# Patient Record
Sex: Male | Born: 1955 | Race: White | Hispanic: No | Marital: Married | Smoking: Never smoker
Health system: Southern US, Community
[De-identification: ages and names within clinical notes are randomized; demographics above are authoritative.]

## PROBLEM LIST (undated history)

## (undated) DIAGNOSIS — I10 Essential (primary) hypertension: Secondary | ICD-10-CM

## (undated) HISTORY — PX: APPENDECTOMY: SHX54

---

## 2019-10-13 ENCOUNTER — Other Ambulatory Visit: Payer: Self-pay

## 2019-10-13 ENCOUNTER — Encounter (HOSPITAL_COMMUNITY): Payer: Self-pay | Admitting: Emergency Medicine

## 2019-10-13 ENCOUNTER — Inpatient Hospital Stay (HOSPITAL_COMMUNITY)
Admission: EM | Admit: 2019-10-13 | Discharge: 2019-10-14 | DRG: 247 | Disposition: A | Discharge status: Home / Self Care | Payer: Medicare Other | Attending: Cardiology | Admitting: Cardiology

## 2019-10-13 ENCOUNTER — Encounter (HOSPITAL_COMMUNITY)
Admission: EM | Disposition: A | Discharge status: Home / Self Care | Payer: Self-pay | Source: Home / Self Care | Attending: Cardiology

## 2019-10-13 ENCOUNTER — Emergency Department (HOSPITAL_COMMUNITY): Payer: Medicare Other

## 2019-10-13 DIAGNOSIS — I214 Non-ST elevation (NSTEMI) myocardial infarction: Principal | ICD-10-CM | POA: Diagnosis present

## 2019-10-13 DIAGNOSIS — Z20822 Contact with and (suspected) exposure to covid-19: Secondary | ICD-10-CM | POA: Diagnosis present

## 2019-10-13 DIAGNOSIS — I2510 Atherosclerotic heart disease of native coronary artery without angina pectoris: Secondary | ICD-10-CM | POA: Diagnosis present

## 2019-10-13 DIAGNOSIS — G4733 Obstructive sleep apnea (adult) (pediatric): Secondary | ICD-10-CM | POA: Diagnosis present

## 2019-10-13 DIAGNOSIS — R739 Hyperglycemia, unspecified: Secondary | ICD-10-CM | POA: Diagnosis present

## 2019-10-13 DIAGNOSIS — I10 Essential (primary) hypertension: Secondary | ICD-10-CM | POA: Diagnosis present

## 2019-10-13 DIAGNOSIS — I200 Unstable angina: Secondary | ICD-10-CM

## 2019-10-13 DIAGNOSIS — Z882 Allergy status to sulfonamides status: Secondary | ICD-10-CM | POA: Diagnosis not present

## 2019-10-13 DIAGNOSIS — E782 Mixed hyperlipidemia: Secondary | ICD-10-CM | POA: Diagnosis present

## 2019-10-13 DIAGNOSIS — Z888 Allergy status to other drugs, medicaments and biological substances status: Secondary | ICD-10-CM

## 2019-10-13 DIAGNOSIS — Z955 Presence of coronary angioplasty implant and graft: Secondary | ICD-10-CM

## 2019-10-13 DIAGNOSIS — Z885 Allergy status to narcotic agent status: Secondary | ICD-10-CM | POA: Diagnosis not present

## 2019-10-13 DIAGNOSIS — R0789 Other chest pain: Secondary | ICD-10-CM | POA: Diagnosis present

## 2019-10-13 DIAGNOSIS — Z8249 Family history of ischemic heart disease and other diseases of the circulatory system: Secondary | ICD-10-CM | POA: Diagnosis not present

## 2019-10-13 HISTORY — PX: LEFT HEART CATH AND CORONARY ANGIOGRAPHY: CATH118249

## 2019-10-13 HISTORY — PX: CORONARY STENT INTERVENTION: CATH118234

## 2019-10-13 HISTORY — DX: Essential (primary) hypertension: I10

## 2019-10-13 HISTORY — PX: CORONARY THROMBECTOMY: CATH118304

## 2019-10-13 LAB — CBC
HCT: 45 % (ref 39.0–52.0)
Hemoglobin: 16.2 g/dL (ref 13.0–17.0)
MCH: 33.5 pg (ref 26.0–34.0)
MCHC: 36 g/dL (ref 30.0–36.0)
MCV: 93.2 fL (ref 80.0–100.0)
Platelets: 192 10*3/uL (ref 150–400)
RBC: 4.83 MIL/uL (ref 4.22–5.81)
RDW: 12 % (ref 11.5–15.5)
WBC: 6.7 10*3/uL (ref 4.0–10.5)
nRBC: 0 % (ref 0.0–0.2)

## 2019-10-13 LAB — LIPID PANEL
Cholesterol: 173 mg/dL (ref 0–200)
HDL: 35 mg/dL — ABNORMAL LOW (ref >40)
LDL Cholesterol: 85 mg/dL (ref 0–99)
Total CHOL/HDL Ratio: 4.9 RATIO
Triglycerides: 265 mg/dL — ABNORMAL HIGH (ref <150)
VLDL: 53 mg/dL — ABNORMAL HIGH (ref 0–40)

## 2019-10-13 LAB — BASIC METABOLIC PANEL
Anion gap: 13 (ref 5–15)
BUN: 16 mg/dL (ref 8–23)
CO2: 19 mmol/L — ABNORMAL LOW (ref 22–32)
Calcium: 9.3 mg/dL (ref 8.9–10.3)
Chloride: 109 mmol/L (ref 98–111)
Creatinine, Ser: 1.23 mg/dL (ref 0.61–1.24)
GFR calc Af Amer: >60 mL/min (ref >60)
GFR calc non Af Amer: >60 mL/min (ref >60)
Glucose, Bld: 124 mg/dL — ABNORMAL HIGH (ref 70–99)
Potassium: 3.8 mmol/L (ref 3.5–5.1)
Sodium: 141 mmol/L (ref 135–145)

## 2019-10-13 LAB — RESPIRATORY PANEL BY RT PCR (FLU A&B, COVID)
Influenza A by PCR: NEGATIVE
Influenza B by PCR: NEGATIVE
SARS Coronavirus 2 by RT PCR: NEGATIVE

## 2019-10-13 LAB — CBG MONITORING, ED: Glucose-Capillary: 114 mg/dL — ABNORMAL HIGH (ref 70–99)

## 2019-10-13 LAB — TROPONIN I (HIGH SENSITIVITY)
Troponin I (High Sensitivity): 121 ng/L (ref <18)
Troponin I (High Sensitivity): 666 ng/L (ref <18)

## 2019-10-13 SURGERY — LEFT HEART CATH AND CORONARY ANGIOGRAPHY
Anesthesia: LOCAL

## 2019-10-13 MED ORDER — ONDANSETRON HCL 4 MG/2ML IJ SOLN: 4.0000 mg | Freq: Four times a day (QID) | INTRAMUSCULAR | Status: DC | PRN

## 2019-10-13 MED ORDER — TICAGRELOR 90 MG PO TABS
90.0000 mg | ORAL_TABLET | Freq: Two times a day (BID) | ORAL | Status: DC
  Administered 2019-10-14: 90 mg via ORAL
  Filled 2019-10-13: qty 1

## 2019-10-13 MED ORDER — LIDOCAINE HCL (PF) 1 % IJ SOLN
INTRAMUSCULAR | Status: DC | PRN
  Administered 2019-10-13: 2 mL

## 2019-10-13 MED ORDER — ASPIRIN 81 MG PO CHEW
81.0000 mg | CHEWABLE_TABLET | Freq: Every day | ORAL | Status: DC
  Administered 2019-10-14: 08:00:00 81 mg via ORAL
  Filled 2019-10-13: qty 1

## 2019-10-13 MED ORDER — SODIUM CHLORIDE 0.9% FLUSH
3.0000 mL | Freq: Two times a day (BID) | INTRAVENOUS | Status: DC
  Administered 2019-10-14: 3 mL via INTRAVENOUS

## 2019-10-13 MED ORDER — SODIUM CHLORIDE 0.9 % WEIGHT BASED INFUSION
1.0000 mL/kg/h | INTRAVENOUS | Status: AC
  Administered 2019-10-13: 1 mL/kg/h via INTRAVENOUS

## 2019-10-13 MED ORDER — HEPARIN SODIUM (PORCINE) 1000 UNIT/ML IJ SOLN
INTRAMUSCULAR | Status: AC
  Filled 2019-10-13: qty 1

## 2019-10-13 MED ORDER — SODIUM CHLORIDE 0.9% FLUSH: 3.0000 mL | INTRAVENOUS | Status: DC | PRN

## 2019-10-13 MED ORDER — ATORVASTATIN CALCIUM 80 MG PO TABS: 80.0000 mg | ORAL_TABLET | Freq: Every day | ORAL | Status: DC

## 2019-10-13 MED ORDER — IOHEXOL 350 MG/ML SOLN
INTRAVENOUS | Status: DC | PRN
  Administered 2019-10-13: 195 mL

## 2019-10-13 MED ORDER — TICAGRELOR 90 MG PO TABS
ORAL_TABLET | ORAL | Status: DC | PRN
  Administered 2019-10-13: 180 mg via ORAL

## 2019-10-13 MED ORDER — TIROFIBAN HCL IN NACL 5-0.9 MG/100ML-% IV SOLN
INTRAVENOUS | Status: DC | PRN
  Administered 2019-10-13: 0.15 ug/kg/min via INTRAVENOUS

## 2019-10-13 MED ORDER — MIDAZOLAM HCL 2 MG/2ML IJ SOLN
INTRAMUSCULAR | Status: AC
  Filled 2019-10-13: qty 2

## 2019-10-13 MED ORDER — HEPARIN (PORCINE) IN NACL 1000-0.9 UT/500ML-% IV SOLN
INTRAVENOUS | Status: AC
  Filled 2019-10-13: qty 1000

## 2019-10-13 MED ORDER — LIDOCAINE HCL (PF) 1 % IJ SOLN
INTRAMUSCULAR | Status: AC
  Filled 2019-10-13: qty 30

## 2019-10-13 MED ORDER — NITROGLYCERIN 1 MG/10 ML FOR IR/CATH LAB
INTRA_ARTERIAL | Status: AC
  Filled 2019-10-13: qty 10

## 2019-10-13 MED ORDER — HYDRALAZINE HCL 20 MG/ML IJ SOLN: 5.0000 mg | INTRAMUSCULAR | Status: AC | PRN

## 2019-10-13 MED ORDER — TICAGRELOR 90 MG PO TABS
ORAL_TABLET | ORAL | Status: AC
  Filled 2019-10-13: qty 2

## 2019-10-13 MED ORDER — NITROGLYCERIN IN D5W 200-5 MCG/ML-% IV SOLN
0.0000 ug/min | INTRAVENOUS | Status: DC
  Administered 2019-10-13: 5 ug/min via INTRAVENOUS
  Filled 2019-10-13: qty 250

## 2019-10-13 MED ORDER — FENTANYL CITRATE (PF) 100 MCG/2ML IJ SOLN
INTRAMUSCULAR | Status: DC | PRN
  Administered 2019-10-13 (×2): 25 ug via INTRAVENOUS

## 2019-10-13 MED ORDER — VERAPAMIL HCL 2.5 MG/ML IV SOLN
INTRAVENOUS | Status: DC | PRN
  Administered 2019-10-13: 18:00:00 5 mL via INTRA_ARTERIAL

## 2019-10-13 MED ORDER — NITROGLYCERIN 1 MG/10 ML FOR IR/CATH LAB
INTRA_ARTERIAL | Status: DC | PRN
  Administered 2019-10-13: 200 ug via INTRACORONARY

## 2019-10-13 MED ORDER — ONDANSETRON HCL 4 MG/2ML IJ SOLN
INTRAMUSCULAR | Status: AC
  Filled 2019-10-13: qty 2

## 2019-10-13 MED ORDER — HEPARIN (PORCINE) IN NACL 1000-0.9 UT/500ML-% IV SOLN
INTRAVENOUS | Status: DC | PRN
  Administered 2019-10-13 (×2): 500 mL

## 2019-10-13 MED ORDER — TIROFIBAN HCL IN NACL 5-0.9 MG/100ML-% IV SOLN
INTRAVENOUS | Status: AC
  Filled 2019-10-13: qty 100

## 2019-10-13 MED ORDER — MIDAZOLAM HCL 2 MG/2ML IJ SOLN
INTRAMUSCULAR | Status: DC | PRN
  Administered 2019-10-13: 2 mg via INTRAVENOUS
  Administered 2019-10-13: 1 mg via INTRAVENOUS

## 2019-10-13 MED ORDER — HEPARIN BOLUS VIA INFUSION
4000.0000 [IU] | Freq: Once | INTRAVENOUS | Status: AC
  Administered 2019-10-13: 4000 [IU] via INTRAVENOUS
  Filled 2019-10-13: qty 4000

## 2019-10-13 MED ORDER — TIROFIBAN (AGGRASTAT) BOLUS VIA INFUSION
INTRAVENOUS | Status: DC | PRN
  Administered 2019-10-13: 2097.5 ug via INTRAVENOUS

## 2019-10-13 MED ORDER — HEPARIN (PORCINE) 25000 UT/250ML-% IV SOLN
1000.0000 [IU]/h | INTRAVENOUS | Status: DC
  Administered 2019-10-13: 1000 [IU]/h via INTRAVENOUS
  Filled 2019-10-13: qty 250

## 2019-10-13 MED ORDER — VERAPAMIL HCL 2.5 MG/ML IV SOLN
INTRAVENOUS | Status: AC
  Filled 2019-10-13: qty 2

## 2019-10-13 MED ORDER — ONDANSETRON HCL 4 MG/2ML IJ SOLN
INTRAMUSCULAR | Status: DC | PRN
  Administered 2019-10-13: 4 mg via INTRAVENOUS

## 2019-10-13 MED ORDER — SODIUM CHLORIDE 0.9 % IV SOLN: 250.0000 mL | INTRAVENOUS | Status: DC | PRN

## 2019-10-13 MED ORDER — FENTANYL CITRATE (PF) 100 MCG/2ML IJ SOLN
INTRAMUSCULAR | Status: AC
  Filled 2019-10-13: qty 2

## 2019-10-13 MED ORDER — FENTANYL CITRATE (PF) 100 MCG/2ML IJ SOLN
50.0000 ug | INTRAMUSCULAR | Status: DC | PRN
  Administered 2019-10-13 – 2019-10-14 (×2): 50 ug via INTRAVENOUS
  Filled 2019-10-13 (×2): qty 2

## 2019-10-13 MED ORDER — HEPARIN SODIUM (PORCINE) 1000 UNIT/ML IJ SOLN
INTRAMUSCULAR | Status: DC | PRN
  Administered 2019-10-13: 3000 [IU] via INTRAVENOUS
  Administered 2019-10-13: 8000 [IU] via INTRAVENOUS
  Administered 2019-10-13: 2000 [IU] via INTRAVENOUS

## 2019-10-13 SURGICAL SUPPLY — 21 items
BALLN SAPPHIRE 2.5X12 (BALLOONS) ×4
BALLN SAPPHIRE 3.0X12 (BALLOONS) ×4
BALLN SAPPHIRE 3.0X15 (BALLOONS) ×2
BALLOON SAPPHIRE 2.5X12 (BALLOONS) ×2 IMPLANT
BALLOON SAPPHIRE 3.0X12 (BALLOONS) ×2 IMPLANT
BALLOON SAPPHIRE 3.0X15 (BALLOONS) ×1 IMPLANT
CATH EXTRAC PRONTO 5.5F 138CM (CATHETERS) ×2 IMPLANT
CATH OPTITORQUE TIG 4.0 5F (CATHETERS) ×2 IMPLANT
CATH VISTA GUIDE 6FR XBLAD3.5 (CATHETERS) ×2 IMPLANT
DEVICE RAD COMP TR BAND LRG (VASCULAR PRODUCTS) ×2 IMPLANT
ELECT DEFIB PAD ADLT CADENCE (PAD) ×2 IMPLANT
GLIDESHEATH SLEND A-KIT 6F 22G (SHEATH) ×2 IMPLANT
GUIDEWIRE INQWIRE 1.5J.035X260 (WIRE) ×1 IMPLANT
INQWIRE 1.5J .035X260CM (WIRE) ×2
KIT ENCORE 26 ADVANTAGE (KITS) ×4 IMPLANT
KIT HEART LEFT (KITS) ×2 IMPLANT
PACK CARDIAC CATHETERIZATION (CUSTOM PROCEDURE TRAY) ×2 IMPLANT
STENT RESOLUTE ONYX 3.5X22 (Permanent Stent) ×2 IMPLANT
TRANSDUCER W/STOPCOCK (MISCELLANEOUS) ×2 IMPLANT
TUBING CIL FLEX 10 FLL-RA (TUBING) ×2 IMPLANT
WIRE COUGAR XT STRL 190CM (WIRE) ×4 IMPLANT

## 2019-10-13 NOTE — ED Notes (Signed)
Labs sent. Sent blue and dr green with temp labels.

## 2019-10-13 NOTE — Progress Notes (Signed)
ANTICOAGULATION CONSULT NOTE - Initial Consult  Pharmacy Consult for heparin Indication: chest pain/ACS  Allergies  Allergen Reactions  . Morphine And Related   . Proventil [Albuterol]     Patient Measurements: Height: 5\' 10"  (177.8 cm) Weight: 185 lb (83.9 kg) IBW/kg (Calculated) : 73 Heparin Dosing Weight: 83.9 kg (estimated weight)  Vital Signs: Temp: 98.5 F (36.9 C) (02/18 1510) Temp Source: Oral (02/18 1510) BP: 142/75 (02/18 1510) Pulse Rate: 92 (02/18 1510)  Labs: No results for input(s): HGB, HCT, PLT, APTT, LABPROT, INR, HEPARINUNFRC, HEPRLOWMOCWT, CREATININE, CKTOTAL, CKMB, TROPONINIHS in the last 72 hours.  CrCl cannot be calculated (No successful lab value found.).   Medical History: Past Medical History:  Diagnosis Date  . Hypertension     Medications:  Scheduled:  . heparin  4,000 Units Intravenous Once   Infusions:  . heparin    . nitroGLYCERIN      Assessment: 64 yo M with chest pain. No anticoagulants PTA per patient. Pharmacy consulted to initiate heparin drip for ACS.  Goal of Therapy:  Heparin level 0.3-0.7 units/ml Monitor platelets by anticoagulation protocol: Yes   Plan:  Heparin 4,000 unit IV x 1, then heparin IV 1,000 units/hr  Check 6 hour heparin level Daily heparin level and CBC Monitor for bleeding  69, PharmD, [facility] PGY2 Cardiology Pharmacy Resident Phone [phone] 10/13/2019       3:27 PM  Please check [email or web address] for unit-specific pharmacist phone numbers

## 2019-10-13 NOTE — ED Triage Notes (Signed)
Cp after pulling multiple times on generator ysterday , got better but today had another episode  Had a lot of cp and sob with least exertion and just walking has iv 20 left hand  Given 324 asa and 2 nitro per ems has large  Family hx

## 2019-10-13 NOTE — H&P (Signed)
CARDIOLOGY ADMIT NOTE   Patient ID: Ruben Rosales MRN: [id number] DOB/AGE: 12-05-1955 64 y.o.  Admit date: 10/13/2019 Primary Physician:  No primary care provider on file.  Patient ID: Ruben Rosales, male    DOB: Jan 14, 1956, 64 y.o.   MRN: [id number]  Chief Complaint  Patient presents with  . Chest Pain   HPI:    Ruben Rosales  is a 64 y.o. Caucasian male with history of hypertension but no other cardiovascular history, strong family still premature coronary disease, brother has had MI at age 31, father with MI at age 68 and grandmother paternal also had MI in her 23s.  He works in [employer], was trying to light of the generator yesterday and had severe chest tightness associated with shortness of breath.  He rested for a while with relief, but then started having chest discomfort again even with minimal activities around the house.  After he rested felt better, went to bed, this morning went to work and every time he would walk up the hill while he was working on a generator, he had to stop and had chest tightness associated with marked dyspnea.  Eventually chest pain was persistent associated with marked dyspnea presented to the emergency room.  In the ED he was started on IV heparin and nitroglycerin.  In spite of this continues to have chest pain.  States that it is becoming very difficult for him to breathe.  Denies hemoptysis, no recent travel, no leg edema.  He is a non-smoker, does not use any tobacco products.  Past Medical History:  Diagnosis Date  . Hypertension    Family history: Please see my HPI.  Social History   Socioeconomic History  . Marital status: Married    Spouse name: Not on file  . Number of children: Not on file  . Years of education: Not on file  . Highest education level: Not on file  Occupational History  . Not on file  Tobacco Use  . Smoking status: Never Smoker  . Smokeless tobacco: Never Used  Substance and Sexual Activity  . Alcohol use: Yes  .  Drug use: Never  . Sexual activity: Not on file  Other Topics Concern  . Not on file  Social History Narrative  . Not on file   Social Determinants of Health   Financial Resource Strain:   . Difficulty of Paying Living Expenses: Not on file  Food Insecurity:   . Worried About Programme researcher, broadcasting/film/video in the Last Year: Not on file  . Ran Out of Food in the Last Year: Not on file  Transportation Needs:   . Lack of Transportation (Medical): Not on file  . Lack of Transportation (Non-Medical): Not on file  Physical Activity:   . Days of Exercise per Week: Not on file  . Minutes of Exercise per Session: Not on file  Stress:   . Feeling of Stress : Not on file  Social Connections:   . Frequency of Communication with Friends and Family: Not on file  . Frequency of Social Gatherings with Friends and Family: Not on file  . Attends Religious Services: Not on file  . Active Member of Clubs or Organizations: Not on file  . Attends Banker Meetings: Not on file  . Marital Status: Not on file  Intimate Partner Violence:   . Fear of Current or Ex-Partner: Not on file  . Emotionally Abused: Not on file  . Physically Abused: Not on file  .  Sexually Abused: Not on file   ROS  Review of Systems  Cardiovascular: Positive for chest pain and dyspnea on exertion. Negative for leg swelling.  Gastrointestinal: Negative for melena.  All other systems reviewed and are negative.  Objective   Vitals with BMI 10/13/2019 10/13/2019 10/13/2019  Height - - -  Weight - - -  BMI - - -  Systolic 119 126 712  Diastolic 62 67 70  Pulse 88 91 91      Physical Exam  Cardiovascular: Normal rate, regular rhythm, normal heart sounds and intact distal pulses. Exam reveals no gallop.  No murmur heard. No leg edema, no JVD.  Pulmonary/Chest: Effort normal and breath sounds normal.  Abdominal: Soft. Bowel sounds are normal.  Skin: Skin is warm and dry.   Laboratory examination:   Recent Labs     10/13/19 1553  NA 141  K 3.8  CL 109  CO2 19*  GLUCOSE 124*  BUN 16  CREATININE 1.23  CALCIUM 9.3  GFRNONAA >60  GFRAA >60   estimated creatinine clearance is 61 mL/min (by C-G formula based on SCr of 1.23 mg/dL).  CMP Latest Ref Rng & Units 10/13/2019  Glucose 70 - 99 mg/dL 197(J)  BUN 8 - 23 mg/dL 16  Creatinine 8.83 - 2.54 mg/dL 9.82  Sodium 641 - 583 mmol/L 141  Potassium 3.5 - 5.1 mmol/L 3.8  Chloride 98 - 111 mmol/L 109  CO2 22 - 32 mmol/L 19(L)  Calcium 8.9 - 10.3 mg/dL 9.3   CBC Latest Ref Rng & Units 10/13/2019  WBC 4.0 - 10.5 K/uL 6.7  Hemoglobin 13.0 - 17.0 g/dL 09.4  Hematocrit 07.6 - 52.0 % 45.0  Platelets 150 - 400 K/uL 192    Ref Range & Units 15:53  Troponin I (High Sensitivity) <18 ng/L 121High Panic    Comment: CRITICAL RESULT CALLED TO, READ BACK BY AND VERIFIED WITH:          Lipid Panel  No results found for: CHOL, TRIG, HDL, CHOLHDL, VLDL, LDLCALC, LDLDIRECT HEMOGLOBIN A1C No results found for: HGBA1C, MPG TSH No results for input(s): TSH in the last 8760 hours. BNP (last 3 results) No results for input(s): BNP in the last 8760 hours.  Medications and allergies   Allergies  Allergen Reactions  . Proventil [Albuterol] Shortness Of Breath  . Sulfamethoxazole Nausea And Vomiting  . Morphine And Related Rash  . Oxycodone-Acetaminophen Nausea Only     Prior to Admission medications   Not on File    . heparin 1,000 Units/hr (10/13/19 1548)  . [MAR Hold] nitroGLYCERIN 5 mcg/min (10/13/19 1552)    No current outpatient medications  No intake/output data recorded. No intake/output data recorded.    Radiology:  DG Chest Portable 1 View  Result Date: 10/13/2019 CLINICAL DATA:  64 year old male with chest pain. EXAM: PORTABLE CHEST 1 VIEW COMPARISON:  None. FINDINGS: Minimal left lung base linear atelectasis/scarring. No focal consolidation, pleural effusion, or pneumothorax. The cardiac silhouette is within normal limits. No acute  osseous pathology. IMPRESSION: No active disease. Electronically Signed   By: Elgie Collard M.D.   On: 10/13/2019 16:02   Cardiac Studies:   EKG 10/13/2019: Normal sinus rhythm.  Assessment   1.  Non-STEMI presenting with chest pain and marked dyspnea. 2.  Strong family still premature artery disease 3.  Hypertension  Recommendations:   Patient with ongoing chest pain in spite of being on IV heparin and nitroglycerin, although EKG silent, suspect he probably has significant  coronary disease.  Do not suspect pulmonary embolism.  We will schedule him for urgent cardiac catheterization and make further recommendations.  I called his wife over the telephone and explained to her regarding the process.  I also discussed with the patient regarding risks and benefits of cardiac catheterization.  Schedule for cardiac catheterization, and possible angioplasty. We discussed regarding risks, benefits, alternatives to this including stress testing, CTA and continued medical therapy. Patient wants to proceed. Understands <1-2% risk of death, stroke, MI, urgent CABG, bleeding, infection, renal failure but not limited to these.   Adrian Prows, MD, [facility] 10/13/2019, 5:19 PM [facility] [place] Pager: [phone] Office: [phone]

## 2019-10-13 NOTE — ED Provider Notes (Signed)
[facility] EMERGENCY DEPARTMENT Provider Note   CSN: [id number] Arrival date & time: 10/13/19  1458     History Chief Complaint  Patient presents with  . Chest Pain    Ruben Rosales is a 64 y.o. male.  Patient is a 64 year old male with a history of hypertension presenting today via EMS with chest pain.  Patient reports the pain started last night after he was attempting to start a generator.  He states that he was pulling repeatedly because the generator would not start and pulled approximately 100 times.  It was after doing this that he started feeling a very heavy sensation in the left portion of his chest and shortness of breath.  The symptoms did improve after he sat down and rested and went away to where he went to bed.  However he works with air conditioning and heating and when he got to work today he had to walk up an incline to the house and the chest pain started again with significant shortness of breath but would only last for 5 or 10 minutes and when he would sit down the pain would go away.  He was able to finish his first job but when he went to the next house he walked around the corner of the back of the house and symptoms started again to the point where he did not think he could make it back to the truck.  In route with EMS he was given 2 nitroglycerin and aspirin and states the pain went from an 8 out of 10 to a 2 out of 10 and he is feeling better.  He denies any cough, congestion.  There is no pleuritic type pain.  He was pulling the generator with his right arm not his left and he has had no pain on the right side of his chest.  He denies any abdominal pain, nausea or vomiting.  No similar symptoms.  He does not use tobacco, drugs or alcohol.  He only takes losartan and he is not prescribed any other medications.  No recent immobilization, abnormal sensation in the legs and reports that his arm pain and numbness went away after taking the  nitroglycerin.  The history is provided by the patient.  Chest Pain Pain location:  Substernal area, L chest and L lateral chest Pain quality: aching, pressure and tightness   Pain radiates to:  L shoulder and L arm Pain severity:  Severe Onset quality:  Gradual Duration:  24 hours Timing:  Intermittent Progression:  Improving Chronicity:  New Associated symptoms: shortness of breath   Associated symptoms: no abdominal pain, no altered mental status, no anorexia, no back pain, no cough, no diaphoresis, no dizziness, no lower extremity edema, no nausea and no near-syncope   Risk factors: hypertension and male sex   Risk factors: no coronary artery disease, no diabetes mellitus, not obese, no prior DVT/PE and no smoking   Risk factors comment:  Father, paternal grandfather and uncles and older brother all with MI in their 92's.      Past Medical History:  Diagnosis Date  . Hypertension     There are no problems to display for this patient.        No family history on file.  Social History   Tobacco Use  . Smoking status: Never Smoker  . Smokeless tobacco: Never Used  Substance Use Topics  . Alcohol use: Yes  . Drug use: Never    Home Medications Prior  to Admission medications   Not on File    Allergies    Morphine and related and Proventil [albuterol]  Review of Systems   Review of Systems  Constitutional: Negative for diaphoresis.  Respiratory: Positive for shortness of breath. Negative for cough.   Cardiovascular: Positive for chest pain. Negative for near-syncope.  Gastrointestinal: Negative for abdominal pain, anorexia and nausea.  Musculoskeletal: Negative for back pain.  Neurological: Negative for dizziness.  All other systems reviewed and are negative.   Physical Exam Updated Vital Signs BP (!) 142/75 (BP Location: Right Arm)   Pulse 92   Temp 98.5 F (36.9 C) (Oral)   Resp (!) 22   SpO2 93%   Physical Exam Vitals and nursing note  reviewed.  Constitutional:      General: He is not in acute distress.    Appearance: He is well-developed and normal weight.  HENT:     Head: Normocephalic and atraumatic.  Eyes:     Conjunctiva/sclera: Conjunctivae normal.     Pupils: Pupils are equal, round, and reactive to light.  Cardiovascular:     Rate and Rhythm: Normal rate and regular rhythm.     Pulses: Normal pulses.     Heart sounds: No murmur.  Pulmonary:     Effort: Pulmonary effort is normal. Tachypnea present. No respiratory distress.     Breath sounds: Normal breath sounds. No wheezing or rales.  Chest:     Chest wall: No tenderness.  Abdominal:     General: There is no distension.     Palpations: Abdomen is soft.     Tenderness: There is no abdominal tenderness. There is no guarding or rebound.  Musculoskeletal:        General: No tenderness. Normal range of motion.     Cervical back: Normal range of motion and neck supple.     Right lower leg: No edema.     Left lower leg: No edema.  Skin:    General: Skin is warm and dry.     Capillary Refill: Capillary refill takes less than 2 seconds.     Findings: No erythema or rash.  Neurological:     General: No focal deficit present.     Mental Status: He is alert and oriented to person, place, and time. Mental status is at baseline.  Psychiatric:        Mood and Affect: Mood normal.        Behavior: Behavior normal.        Thought Content: Thought content normal.     ED Results / Procedures / Treatments   Labs (all labs ordered are listed, but only abnormal results are displayed) Labs Reviewed  CBG MONITORING, ED - Abnormal; Notable for the following components:      Result Value   Glucose-Capillary 114 (*)    All other components within normal limits  RESPIRATORY PANEL BY RT PCR (FLU A&B, COVID)  CBC  BASIC METABOLIC PANEL  HEPARIN LEVEL (UNFRACTIONATED)  POC SARS CORONAVIRUS 2 AG -  ED  TROPONIN I (HIGH SENSITIVITY)    EKG EKG  Interpretation  Date/Time:  Thursday October 13 2019 15:04:25 EST Ventricular Rate:  93 PR Interval:    QRS Duration: 99 QT Interval:  370 QTC Calculation: 461 R Axis:   38 Text Interpretation: Sinus rhythm RSR' in V1 or V2, right VCD or RVH No previous tracing Confirmed by Blanchie Dessert [id number]) on 10/13/2019 3:27:43 PM   Radiology DG Chest Portable 1 View  Result Date: 10/13/2019 CLINICAL DATA:  64 year old male with chest pain. EXAM: PORTABLE CHEST 1 VIEW COMPARISON:  None. FINDINGS: Minimal left lung base linear atelectasis/scarring. No focal consolidation, pleural effusion, or pneumothorax. The cardiac silhouette is within normal limits. No acute osseous pathology. IMPRESSION: No active disease. Electronically Signed   By: Elgie Collard M.D.   On: 10/13/2019 16:02    Procedures Procedures (including critical care time)  Medications Ordered in ED Medications  nitroGLYCERIN 50 mg in dextrose 5 % 250 mL (0.2 mg/mL) infusion (has no administration in time range)    ED Course  I have reviewed the triage vital signs and the nursing notes.  Pertinent labs & imaging results that were available during my care of the patient were reviewed by me and considered in my medical decision making (see chart for details).    MDM Rules/Calculators/A&P                      64 year old male presenting today with concerning history for stable angina.  Symptoms started yesterday after attempting to get a generator started.  He describes it as a crushing pain in the left center of his chest that today radiated down his right arm every time he started doing something.  He notes that it became significantly worse anytime he tried to walk up an incline and he was significantly short of breath.  He received 2 nitroglycerin and 325 of aspirin in route by EMS and states now the pain is 2 out of 10.  Its not pleuritic in nature and he denies any infectious symptoms.  Patient has breath sounds equally  and lower suspicion for pneumothorax.  Low suspicion for musculoskeletal as he was using his right arm to try to start the generator and the pain is in the left side of his chest.  He has significant family history of his father, paternal grandfather and uncles and his older brother all with MIs in their 40s.  Patient reports that he does not smoke or use alcohol.  He does have hypertension but does not take medicine for hyperlipidemia.  Low suspicion for PE at this time.  Low suspicion for dissection.  Patient started on a nitroglycerin drip and heparin gtt, labs pending. Initial EKG without acute findings.  Due to concerning story and pt still having mild active chest pain will consult cards.  CRITICAL CARE Performed by: Genie Wenke Total critical care time: 30 minutes Critical care time was exclusive of separately billable procedures and treating other patients. Critical care was necessary to treat or prevent imminent or life-threatening deterioration. Critical care was time spent personally by me on the following activities: development of treatment plan with patient and/or surrogate as well as nursing, discussions with consultants, evaluation of patient's response to treatment, examination of patient, obtaining history from patient or surrogate, ordering and performing treatments and interventions, ordering and review of laboratory studies, ordering and review of radiographic studies, pulse oximetry and re-evaluation of patient's condition.   Final Clinical Impression(s) / ED Diagnoses Final diagnoses:  Unstable angina [facility])    Rx / DC Orders ED Discharge Orders    None       Gwyneth Sprout, MD 10/13/19 1617

## 2019-10-13 NOTE — Interval H&P Note (Signed)
History and Physical Interval Note:  10/13/2019 5:27 PM  Ruben Rosales  has presented today for surgery, with the diagnosis of unstable angina.  The various methods of treatment have been discussed with the patient and family. After consideration of risks, benefits and other options for treatment, the patient has consented to  Procedure(s): LEFT HEART CATH AND CORONARY ANGIOGRAPHY (N/A) and possible angioplasty as a surgical intervention.  The patient's history has been reviewed, patient examined, no change in status, stable for surgery.  I have reviewed the patient's chart and labs.  Questions were answered to the patient's satisfaction.   Cath Lab Visit (complete for each Cath Lab visit)  Clinical Evaluation Leading to the Procedure:   ACS: Yes.    Non-ACS:    Anginal Classification: CCS IV  Anti-ischemic medical therapy: Minimal Therapy (1 class of medications)  Non-Invasive Test Results: No non-invasive testing performed  Prior CABG: No previous CABG  Yates Decamp

## 2019-10-14 ENCOUNTER — Inpatient Hospital Stay (HOSPITAL_COMMUNITY): Payer: Medicare Other

## 2019-10-14 LAB — BASIC METABOLIC PANEL
Anion gap: 6 (ref 5–15)
BUN: 14 mg/dL (ref 8–23)
CO2: 17 mmol/L — ABNORMAL LOW (ref 22–32)
Calcium: 8.6 mg/dL — ABNORMAL LOW (ref 8.9–10.3)
Chloride: 117 mmol/L — ABNORMAL HIGH (ref 98–111)
Creatinine, Ser: 1.08 mg/dL (ref 0.61–1.24)
GFR calc Af Amer: >60 mL/min (ref >60)
GFR calc non Af Amer: >60 mL/min (ref >60)
Glucose, Bld: 115 mg/dL — ABNORMAL HIGH (ref 70–99)
Potassium: 4.1 mmol/L (ref 3.5–5.1)
Sodium: 140 mmol/L (ref 135–145)

## 2019-10-14 LAB — ECHOCARDIOGRAM COMPLETE
Height: 70 in
Weight: 2960 oz

## 2019-10-14 LAB — CBC
HCT: 42.2 % (ref 39.0–52.0)
Hemoglobin: 15.3 g/dL (ref 13.0–17.0)
MCH: 33.8 pg (ref 26.0–34.0)
MCHC: 36.3 g/dL — ABNORMAL HIGH (ref 30.0–36.0)
MCV: 93.2 fL (ref 80.0–100.0)
Platelets: 169 10*3/uL (ref 150–400)
RBC: 4.53 MIL/uL (ref 4.22–5.81)
RDW: 12.3 % (ref 11.5–15.5)
WBC: 7.8 10*3/uL (ref 4.0–10.5)
nRBC: 0 % (ref 0.0–0.2)

## 2019-10-14 LAB — POCT ACTIVATED CLOTTING TIME
Activated Clotting Time: 268 seconds
Activated Clotting Time: 351 seconds
Activated Clotting Time: 709 seconds

## 2019-10-14 LAB — TSH: TSH: 1.816 u[IU]/mL (ref 0.350–4.500)

## 2019-10-14 LAB — HEMOGLOBIN A1C
Hgb A1c MFr Bld: 5.6 % (ref 4.8–5.6)
Mean Plasma Glucose: 114.02 mg/dL

## 2019-10-14 MED ORDER — METOPROLOL SUCCINATE ER 50 MG PO TB24: 50.0000 mg | ORAL_TABLET | Freq: Every day | ORAL | 2 refills | Status: DC

## 2019-10-14 MED ORDER — TICAGRELOR 90 MG PO TABS: 90.0000 mg | ORAL_TABLET | Freq: Two times a day (BID) | ORAL | 0 refills | Status: DC

## 2019-10-14 MED ORDER — ATORVASTATIN CALCIUM 80 MG PO TABS: 80.0000 mg | ORAL_TABLET | Freq: Every day | ORAL | 2 refills | Status: DC

## 2019-10-14 MED ORDER — ASPIRIN 81 MG PO CHEW: 81.0000 mg | CHEWABLE_TABLET | Freq: Every day | ORAL | Status: AC

## 2019-10-14 NOTE — Progress Notes (Signed)
CARDIAC REHAB PHASE I   PRE:  Rate/Rhythm: 89 SR  BP:  Supine: 126/66  Sitting:   Standing:    SaO2: 96%RA  MODE:  Ambulation: 450 ft   POST:  Rate/Rhythm: 101 ST  BP:  Supine:   Sitting: 114/70  Standing:    SaO2: 98%RA 0809-0904 Pt walked 450 ft on RA with steady gait and no CP. Tolerated well. MI education completed with pt who voiced understanding. Stressed importance of brilinta with stent. Reviewed NTG use, MI restrictions, heart healthy food choices, walking for ex, and CRP 2. Referral letter to [facility] CRP 2.    Luetta Nutting, RN BSN  10/14/2019 9:01 AM

## 2019-10-14 NOTE — Progress Notes (Signed)
  Echocardiogram 2D Echocardiogram has been performed.  Leta Jungling M 10/14/2019, 1:10 PM

## 2019-10-14 NOTE — Care Management (Signed)
Per Rayford Halsted [facility] help desk.  Co-pay for Brilinta 90 mg.bid $47.00 for a 30 day supply.   No PA required Deductible not met. Tier: 3 Retail pharmacy: [facility].  Ref# [id number]

## 2019-10-14 NOTE — Progress Notes (Signed)
TR BAND REMOVAL  LOCATION:  right radial  DEFLATED PER PROTOCOL:  Yes.    TIME BAND OFF / DRESSING APPLIED:  2315  SITE UPON ARRIVAL:   Level 2  SITE AFTER BAND REMOVAL:  Level 2  CIRCULATION SENSATION AND MOVEMENT:  Within Normal Limits  Yes.    COMMENTS:  Arrived from cath lab with large hematoma  Approximate 7 cm width and 3 cm in height above the radial site. BP cuff already on arm for pressure management protocol. Shortly after patients arrival to floor Dr.Ganji came to bedside to assess patient. MD wanted me to proceed with TR band removal 2 hours post placement and continue to pressure cuff management to reduce hematoma. TR band removed with no complications. Hematoma greatly reduced awhile later after band removal. Right fingers warm with good capillary refill and sensation present. Radial pulse palpable. Patient instructed not to use arm for activity and keep elevated.

## 2019-10-14 NOTE — TOC Progression Note (Signed)
Transition of Care [facility]) - Progression Note    Patient Details  Name: Ruben Rosales MRN: [id number] Date of Birth: [birth year]  [facility] of [facility]) CM/SW Contact  Leone Haven, RN Phone Number: 10/14/2019, 11:01 AM  Clinical Narrative:    NCM spoke with patient, informed him of the brilinta co pay of 47.00 for refills. Danford Bad RN will give patient the 30 day coupon.  He states he understands. He has no other needs.        Expected Discharge Plan and Services                                                 Social Determinants of Health (SDOH) Interventions    Readmission Risk Interventions No flowsheet data found.

## 2019-10-14 NOTE — Discharge Instructions (Signed)
Angina  Angina is very bad discomfort or pain in the chest, neck, arm, jaw, or back. The discomfort is caused by a lack of blood in the middle layer of the heart wall (myocardium). What are the causes? This condition is caused by a buildup of fat and cholesterol (plaque) in your arteries (atherosclerosis). This buildup narrows the arteries and makes it hard for blood to flow. What increases the risk? You are more likely to develop this condition if:  You have high levels of cholesterol in your blood.  You have high blood pressure (hypertension).  You have diabetes.  You have a family history of heart disease.  You are not active, or you do not exercise enough.  You feel sad (depressed).  You have been treated with high energy rays (radiation) on the left side of your chest. Other risk factors are:  Using tobacco.  Being very overweight (obese).  Eating a diet high in unhealthy fats (saturated fats).  Having stress, or being exposed to things that cause stress.  Using drugs, such as cocaine. Women have a greater risk for angina if:  They are older than 55.  They have stopped having their period (are in postmenopause). What are the signs or symptoms? Common symptoms of this condition in both men and women may include:  Chest pain, which may: ? Feel like a crushing or squeezing in the chest. ? Feel like a tightness, pressure, fullness, or heaviness in the chest. ? Last for more than a few minutes at a time. ? Stop and come back (recur) after a few minutes.  Pain in the neck, arm, jaw, or back.  Heartburn or upset stomach (indigestion) for no reason.  Being short of breath.  Feeling sick to your stomach (nauseous).  Sudden cold sweats. Women and people with diabetes may have other symptoms that are not usual, such as feeling:  Tired (fatigue).  Worried or nervous (anxious) for no reason.  Weak for no reason.  Dizzy or passing out (fainting). How is this  treated? This condition may be treated with:  Medicines. These are given to: ? Prevent blood clots. ? Prevent heart attack. ? Relax blood vessels and improve blood flow to the heart (nitrates). ? Reduce blood pressure. ? Improve the pumping action of the heart. ? Reduce fat and cholesterol in the blood.  A procedure to widen a narrowed or blocked artery in the heart (angioplasty).  Surgery to allow blood to go around a blocked artery (coronary artery bypass surgery). Follow these instructions at home: Medicines  Take over-the-counter and prescription medicines only as told by your doctor.  Do not take these medicines unless your doctor says that you can: ? NSAIDs. These include:  Ibuprofen.  Naproxen. ? Vitamin supplements that have vitamin A, vitamin E, or both. ? Hormone therapy that contains estrogen with or without progestin. Eating and drinking   Eat a heart-healthy diet that includes: ? Lots of fresh fruits and vegetables. ? Whole grains. ? Low-fat (lean) protein. ? Low-fat dairy products.  Follow instructions from your doctor about what you cannot eat or drink. Activity  Follow an exercise program that your doctor tells you.  Talk with your doctor about joining a program to help improve the health of your heart (cardiac rehab).  When you feel tired, take a break. Plan breaks if you know you are going to feel tired. Lifestyle   Do not use any products that contain nicotine or tobacco. This includes cigarettes, e-cigarettes, and   chewing tobacco. If you need help quitting, ask your doctor.  If your doctor says you can drink alcohol: ? Limit how much you use to:  0-1 drink a day for women who are not pregnant.  0-2 drinks a day for men. ? Be aware of how much alcohol is in your drink. In the [place] one drink equals:  One 12 oz bottle of beer (355 mL).  One 5 oz glass of wine (148 mL).  One 1 oz glass of hard liquor (44 mL). General instructions  Stay  at a healthy weight. If your doctor tells you to do so, work with him or her to lose weight.  Learn to deal with stress. If you need help, ask your doctor.  Keep your vaccines up to date. Get a flu shot every year.  Talk with your doctor if you feel sad. Take a screening test to see if you are at risk for depression.  Work with your doctor to manage any other health problems that you have. These may include diabetes or high blood pressure.  Keep all follow-up visits as told by your doctor. This is important. Get help right away if:  You have pain in your chest, neck, arm, jaw, or back, and the pain: ? Lasts more than a few minutes. ? Comes back. ? Does not get better after you take medicine under your tongue (sublingual nitroglycerin). ? Keeps getting worse. ? Comes more often.  You have any of these problems for no reason: ? Sweating a lot. ? Heartburn or upset stomach. ? Shortness of breath. ? Trouble breathing. ? Feeling sick to your stomach. ? Throwing up (vomiting). ? Feeling more tired than normal. ? Feeling nervous or worrying more than normal. ? Weakness.  You are suddenly dizzy or light-headed.  You pass out. These symptoms may be an emergency. Do not wait to see if the symptoms will go away. Get medical help right away. Call your local emergency services (911 in the [place] Do not drive yourself to the hospital. Summary  Angina is very bad discomfort or pain in the chest, neck, arm, neck, or back.  Symptoms include chest pain, heartburn or upset stomach for no reason, and shortness of breath.  Women or people with diabetes may have symptoms that are not usual, such as feeling nervous or worried for no reason, weak for no reason, or tired.  Take all medicines only as told by your doctor.  You should eat a heart-healthy diet and follow an exercise program. This information is not intended to replace advice given to you by your health care provider. Make sure you  discuss any questions you have with your health care provider. Document Revised: 03/29/2018 Document Reviewed: 03/29/2018 Elsevier Patient Education  2020 [employer]   Heart Attack A heart attack occurs when blood and oxygen supply to the heart is cut off. A heart attack causes damage to the heart that cannot be fixed. A heart attack is also called a myocardial infarction, or MI. If you think you are having a heart attack, do not wait to see if the symptoms will go away. Get medical help right away. What are the causes? This condition may be caused by:  A fatty substance (plaque) in the blood vessels (arteries). This can block the flow of blood to the heart.  A blood clot in the blood vessels that go to the heart. The blood clot blocks blood flow.  Low blood pressure.  An abnormal heartbeat.    Some diseases, such as problems in red blood cells (anemia)orproblems in breathing (respiratory failure).  Tightening (spasm) of a blood vessel that cuts off blood to the heart.  A tear in a blood vessel of the heart.  High blood pressure. What increases the risk? The following factors may make you more likely to develop this condition:  Aging. The older you are, the higher your risk.  Having a personal or family history of chest pain, heart attack, stroke, or narrowing of the arteries in the legs, arms, head, or stomach (peripheral artery disease).  Being male.  Smoking.  Not getting regular exercise.  Being overweight or obese.  Having high blood pressure.  Having high cholesterol.  Having diabetes.  Drinking too much alcohol.  Using illegal drugs, such as cocaine or methamphetamine. What are the signs or symptoms? Symptoms of this condition include:  Chest pain. It may feel like: ? Crushing or squeezing. ? Tightness, pressure, fullness, or heaviness.  Pain in the arm, neck, jaw, back, or upper body.  Shortness of breath.  Heartburn.  Upset stomach  (indigestion).  Feeling like you may vomit (nauseous).  Cold sweats.  Feeling tired.  Sudden light-headedness. How is this treated? A heart attack must be treated as soon as possible. Treatment may include:  Medicines to: ? Break up or dissolve blood clots. ? Thin blood and help prevent blood clots. ? Treat blood pressure. ? Improve blood flow to the heart. ? Reduce pain. ? Reduce cholesterol.  Procedures to widen a blocked artery and keep it open.  Open heart surgery.  Receiving oxygen.  Making your heart strong again (cardiac rehabilitation) through exercise, education, and counseling. Follow these instructions at home: Medicines  Take over-the-counter and prescription medicines only as told by your doctor. You may need to take medicine: ? To keep your blood from clotting too easily. ? To control blood pressure. ? To lower cholesterol. ? To control heart rhythms.  Do not take these medicines unless your doctor says it is okay: ? NSAIDs, such as ibuprofen. ? Supplements that have vitamin A, vitamin E, or both. ? Hormone replacement therapy that has estrogen with or without progestin. Lifestyle      Do not use any products that have nicotine or tobacco, such as cigarettes, e-cigarettes, and chewing tobacco. If you need help quitting, ask your doctor.  Avoid secondhand smoke.  Exercise regularly. Ask your doctor about a cardiac rehab program.  Eat heart-healthy foods. Your doctor will tell you what foods to eat.  Stay at a healthy weight.  Lower your stress level.  Do not use illegal drugs. Alcohol use  Do not drink alcohol if: ? Your doctor tells you not to drink. ? You are pregnant, may be pregnant, or are planning to become pregnant.  If you drink alcohol: ? Limit how much you use to:  0-1 drink a day for women.  0-2 drinks a day for men. ? Know how much alcohol is in your drink. In the [place] one drink equals one 12 oz bottle of beer (355 mL),  one 5 oz glass of wine (148 mL), or one 1 oz glass of hard liquor (44 mL). General instructions  Work with your doctor to treat other problems you may have, such as diabetes or high blood pressure.  Get screened for depression. Get treatment if needed.  Keep your vaccines up to date. Get the flu shot (influenza vaccine) every year.  Keep all follow-up visits as told by your doctor.   This is important. Contact a doctor if:  You feel very sad.  You have trouble doing your daily activities. Get help right away if:  You have sudden, unexplained discomfort in your chest, arms, back, neck, jaw, or upper body.  You have shortness of breath.  You have sudden sweating or clammy skin.  You feel like you may vomit.  You vomit.  You feel tired or weak.  You get light-headed or dizzy.  You feel your heart beating fast.  You feel your heart skipping beats.  You have blood pressure that is higher than 180/120. These symptoms may be an emergency. Do not wait to see if the symptoms will go away. Get medical help right away. Call your local emergency services (911 in the [place] Do not drive yourself to the hospital. Summary  A heart attack occurs when blood and oxygen supply to the heart is cut off.  Do not take NSAIDs unless your doctor says it is okay.  Do not smoke. Avoid secondhand smoke.  Exercise regularly. Ask your doctor about a cardiac rehab program. This information is not intended to replace advice given to you by your health care provider. Make sure you discuss any questions you have with your health care provider. Document Revised: 11/22/2018 Document Reviewed: 11/22/2018 Elsevier Patient Education  2020 Else[employer] Inc.  

## 2019-10-14 NOTE — Discharge Summary (Signed)
Physician Discharge Summary  Patient ID: Ruben Rosales MRN: [id number] DOB/AGE: February 18, 1956 64 y.o.  Admit date: 10/13/2019 Discharge date: 10/14/2019  Primary Discharge Diagnosis 1.  Non-ST elevation myocardial infarction involving occluded ostial LAD successful thrombectomy followed by stenting of left main and proximal LAD and balloon angioplasty of circumflex side branch on 10/13/2019. 2.  Essential hypertension 3.  Mixed hyperlipidemia 4.  Hyperglycemia 5.  Obstructive sleep apnea on CPAP and compliant.  Significant Diagnostic Studies:  EKG 10/14/2019: Normal sinus rhythm. Admission EKG 10/13/2019: Normal sinus rhythm.  Left Heart Catheterization 10/13/19:  LV: Normal LV systolic function, no wall motion abnormality.  EDP 9 mmHg. RCA: Dominant, normal. Left main: Large, normal. Circumflex: Large, normal. LAD: Proximal occlusion.  SP thrombectomy with 5.5 F Pronto catheter and successful stenting with 3.5 x 22 mm resolute Onyx covering the proximal LAD and entire left main followed by wiring the circumflex and kissing balloon angioplasty with 3.0x12 and 2.5x15 mm Saphire balloon into ostial Cx and prox LAD and LM kissing balloon angioplasty performed at 14 atm in LM-Cx and 18 atm for Prox LAD-LM lesion for proximal optimization. 100% stenosis reduced to 0% with TIMI 0 to TIMI-3 flow at the end of the procedure.  Echocardiogram 10/14/19:   1. Left ventricular ejection fraction, by estimation, is 50 to 55%. The  left ventricle has low normal function. The left ventricle has no regional  wall motion abnormalities. Left ventricular diastolic parameters were  normal. The average left ventricular  global longitudinal strain is 15.0 %, normal.  2. Right ventricular systolic function is normal. The right ventricular  size is normal.  Hospital Course:  Patient presented to the emergency room with exertional chest pain that started a day before hospital admission, on the day of hospital  admission, started having rest pain with chest tightness with radiation to the left arm associated with marked dyspnea on exertion.  Eventually started having symptoms at rest and upon presentation to the emergency room although EKG did not reveal any abnormality, he was started on IV heparin and also IV nitroglycerin with partial relief of pain and discomfort and also partial improvement in dyspnea.  Due to persistent symptoms he was emergently taken to the cardiac catheterization lab where he was found to have an occluded LAD for which he underwent successful angioplasty to the LAD and left main along with balloon angioplasty to the side branch circumflex.  The following morning as he remained stable, although he had a major vessel occlusion, his cardiac markers were only minimally positive and EKG remained essentially normal, he ambulated with cardiac rehab without any chest pain or dyspnea and hence I felt it was fairly safe enough for him to be discharged home instead of keeping him for additional 24 to 48 hours.  He will be followed very closely in the outpatient basis.  He did have very mild hematoma involving his right forearm which improved after applying blood pressure cuff.  Fortunately in spite of occluded LAD, he did not develop any LV systolic dysfunction or cardiogenic shock.  Recommendations on discharge: Patient was started on high-dose high intensity statins along with aspirin and Brilinta, he will need dual antiplatelet therapy for at least 1 year.  Patient lives in [place], I have recommended that he eventually establish with a cardiologist closer to his home town.  He will also be referred to cardiac rehabilitation at [facility] after I see him in the office.  Discharge Exam: Blood pressure (!) 115/59, pulse 88, temperature  98.2 F (36.8 C), temperature source Oral, resp. rate 15, height 5' 10"  (1.778 m), weight 83.9 kg, SpO2 97 %. Body mass index is 26.54 kg/m.    Physical Exam  Constitutional:  He is well-built and well-nourished in no acute distress.  Cardiovascular: Normal rate, regular rhythm, normal heart sounds and intact distal pulses. Exam reveals no gallop.  No murmur heard. No leg edema, no JVD.  Pulmonary/Chest: Effort normal and breath sounds normal.  Abdominal: Soft. Bowel sounds are normal.  Musculoskeletal:     Comments: Mild ecchymosis involving his right forearm.  Excellent radial pulses bilaterally.  Skin: Skin is warm and dry.   Labs:   Lab Results  Component Value Date   WBC 7.8 10/13/2019   HGB 15.3 10/13/2019   HCT 42.2 10/13/2019   MCV 93.2 10/13/2019   PLT 169 10/13/2019    Recent Labs  Lab 10/13/19 2358  NA 140  K 4.1  CL 117*  CO2 17*  BUN 14  CREATININE 1.08  CALCIUM 8.6*  GLUCOSE 115*    Lipid Panel     Component Value Date/Time   CHOL 173 10/13/2019 1553   TRIG 265 (H) 10/13/2019 1553   HDL 35 (L) 10/13/2019 1553   CHOLHDL 4.9 10/13/2019 1553   VLDL 53 (H) 10/13/2019 1553   LDLCALC 85 10/13/2019 1553    HEMOGLOBIN A1C Lab Results  Component Value Date   HGBA1C 5.6 10/13/2019   MPG 114.02 10/13/2019  TSH Recent Labs    10/13/19 2358  TSH 1.816    Ref Range & Units 1 d ago  (10/13/19) 1 d ago  (10/13/19)  Troponin I (High Sensitivity) <18 ng/L 666High Panic   121High Panic  CM    Radiology: Chest x-ray portable 10/12/2018: Minimal left lung base linear atelectasis/scarring. No focal consolidation, pleural effusion, or pneumothorax. The cardiac silhouette is within normal limits. No acute osseous pathology. IMPRESSION: No active disease.  FOLLOW UP PLANS AND APPOINTMENTS Discharge Instructions    Amb Referral to Cardiac Rehabilitation   Complete by: As directed    Referring to [place] [facility] 2   Diagnosis:  NSTEMI Coronary Stents     After initial evaluation and assessments completed: Virtual Based Care may be provided alone or in conjunction with Phase 2 Cardiac Rehab  based on patient barriers.: Yes     Allergies as of 10/14/2019      Reactions   Proventil [albuterol] Shortness Of Breath   Sulfamethoxazole Nausea And Vomiting   Morphine And Related Rash   Oxycodone-acetaminophen Nausea Only      Medication List    TAKE these medications   aspirin 81 MG chewable tablet Chew 1 tablet (81 mg total) by mouth daily. Start taking on: October 15, 2019   atorvastatin 80 MG tablet Commonly known as: LIPITOR Take 1 tablet (80 mg total) by mouth daily at 6 PM.   famotidine 20 MG tablet Commonly known as: PEPCID Take 40 mg by mouth at bedtime.   losartan 25 MG tablet Commonly known as: COZAAR Take 25 mg by mouth daily.   metoprolol succinate 50 MG 24 hr tablet Commonly known as: TOPROL-XL Take 1 tablet (50 mg total) by mouth daily. Take with or immediately following a meal.   ticagrelor 90 MG Tabs tablet Commonly known as: BRILINTA Take 1 tablet (90 mg total) by mouth 2 (two) times daily.   VITAMIN D PO Take 1 tablet by mouth daily.      Follow-up Information  Adrian Prows, MD Follow up on 10/20/2019.   Specialty: Cardiology Why: 10:45 am appointment Contact information: [place] [address] [phone]          Adrian Prows, MD 10/14/2019, 4:35 PM  Pager: [phone] Office: [phone] If no answer: [phone]

## 2019-10-17 ENCOUNTER — Telehealth: Payer: Self-pay

## 2019-10-17 NOTE — Telephone Encounter (Signed)
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 He was also started on Toprol, Brilinta and Lipitor and was advised that it could cause SOB. He has had some SOB but has also had some chest pains. He is asking if that is normal, to have these side effects after the surgery and would like a phone call.  [phone]

## 2019-10-17 NOTE — Telephone Encounter (Signed)
He was supposed to be a TOC after discharge and I am seeing him in 4 days. Normal to feel this way. Talk to me tomorrow about this

## 2019-10-21 ENCOUNTER — Other Ambulatory Visit: Payer: Self-pay

## 2019-10-21 ENCOUNTER — Ambulatory Visit: Payer: Medicare Other | Admitting: Cardiology

## 2019-10-21 ENCOUNTER — Encounter: Payer: Self-pay | Admitting: Cardiology

## 2019-10-21 VITALS — BP 122/56 | HR 83 | Temp 99.6°F | Resp 16 | Ht 70.0 in | Wt 172.8 lb

## 2019-10-21 DIAGNOSIS — I10 Essential (primary) hypertension: Secondary | ICD-10-CM

## 2019-10-21 DIAGNOSIS — G4733 Obstructive sleep apnea (adult) (pediatric): Secondary | ICD-10-CM

## 2019-10-21 DIAGNOSIS — I2510 Atherosclerotic heart disease of native coronary artery without angina pectoris: Secondary | ICD-10-CM

## 2019-10-21 DIAGNOSIS — E782 Mixed hyperlipidemia: Secondary | ICD-10-CM

## 2019-10-21 MED ORDER — NITROGLYCERIN 0.4 MG SL SUBL: 0.4000 mg | SUBLINGUAL_TABLET | SUBLINGUAL | 3 refills | Status: DC | PRN

## 2019-10-21 NOTE — Progress Notes (Signed)
Primary Physician/Referring:  Danton Sewer, MD  Patient ID: Ruben Rosales, male    DOB: [birth year], 64 y.o.   MRN: [id number]  Chief Complaint  Patient presents with  . Coronary Artery Disease  . Transportation   HPI:    Ruben Rosales  is a 64 y.o. Caucasian male with history of hypertension but no other cardiovascular history, strong family history of premature coronary disease, brother has had MI at age 63, father with MI at age 43 and grandmother paternal also had MI in her 79s. Admitted to the hospital on 10/12/2017 with NSTEMI involving occluded ostial LAD successful thrombectomy followed by stenting of left main and proximal LAD and balloon angioplasty of circumflex side branch on 10/13/2019. He now presents for hospital follow up.    At discharge, started on DAPT with ASA and brilinta and Lipitor 80 mg daily.  As he lives in [place], he will need to establish with Cardiology close to home.  He remains asymptomatic except since he received COVID-19 vaccine a day ago, he feels generally tired since then.  He has right forearm ecchymosis and mild pain.  Past Medical History:  Diagnosis Date  . Hypertension    Past Surgical History:  Procedure Laterality Date  . APPENDECTOMY    . CORONARY STENT INTERVENTION N/A 10/13/2019   Procedure: CORONARY STENT INTERVENTION;  Surgeon: Adrian Prows, MD;  Location: [facility];  Service: Cardiovascular;  Laterality: N/A;  . CORONARY THROMBECTOMY N/A 10/13/2019   Procedure: Coronary Thrombectomy;  Surgeon: Adrian Prows, MD;  Location: [facility];  Service: Cardiovascular;  Laterality: N/A;  . LEFT HEART CATH AND CORONARY ANGIOGRAPHY N/A 10/13/2019   Procedure: LEFT HEART CATH AND CORONARY ANGIOGRAPHY;  Surgeon: Adrian Prows, MD;  Location: [facility];  Service: Cardiovascular;  Laterality: N/A;   Social History   Tobacco Use  . Smoking status: Never Smoker  . Smokeless tobacco: Never Used  Substance Use Topics  . Alcohol  use: Yes    ROS  Review of Systems  Cardiovascular: Negative for chest pain, claudication, dyspnea on exertion, leg swelling, orthopnea, palpitations and syncope.  Gastrointestinal: Negative for melena.  Neurological: Negative for dizziness, focal weakness and headaches.  All other systems reviewed and are negative.  Objective  Blood pressure (!) 122/56, pulse 83, temperature 99.6 F (37.6 C), temperature source Temporal, resp. rate 16, height 5' 10"  (1.778 m), weight 172 lb 12.8 oz (78.4 kg), SpO2 99 %.  Vitals with BMI 10/21/2019 10/14/2019 10/14/2019  Height 5' 10"  - -  Weight 172 lbs 13 oz - -  BMI 35.70 - -  Systolic 177 939 030  Diastolic 56 59 66  Pulse 83 88 80     Physical Exam  Constitutional: He is oriented to person, place, and time. Vital signs are normal. He appears well-developed and well-nourished.  Cardiovascular: Normal rate, regular rhythm, normal heart sounds and intact distal pulses.  Right forearm extensive ecchymosis noted.  Mild tenderness present.  Radial pulses excellent.  Pulmonary/Chest: Effort normal and breath sounds normal. No accessory muscle usage. No respiratory distress.  Neurological: He is alert and oriented to person, place, and time.  Vitals reviewed.  Laboratory examination:   Recent Labs    10/13/19 1553 10/13/19 2358  NA 141 140  K 3.8 4.1  CL 109 117*  CO2 19* 17*  GLUCOSE 124* 115*  BUN 16 14  CREATININE 1.23 1.08  CALCIUM 9.3 8.6*  GFRNONAA >60 >60  GFRAA >60 >60  estimated creatinine clearance is 69.5 mL/min (by C-G formula based on SCr of 1.08 mg/dL).  CMP Latest Ref Rng & Units 10/13/2019 10/13/2019  Glucose 70 - 99 mg/dL 115(H) 124(H)  BUN 8 - 23 mg/dL 14 16  Creatinine 0.61 - 1.24 mg/dL 1.08 1.23  Sodium 135 - 145 mmol/L 140 141  Potassium 3.5 - 5.1 mmol/L 4.1 3.8  Chloride 98 - 111 mmol/L 117(H) 109  CO2 22 - 32 mmol/L 17(L) 19(L)  Calcium 8.9 - 10.3 mg/dL 8.6(L) 9.3   CBC Latest Ref Rng & Units 10/13/2019  10/13/2019  WBC 4.0 - 10.5 K/uL 7.8 6.7  Hemoglobin 13.0 - 17.0 g/dL 15.3 16.2  Hematocrit 39.0 - 52.0 % 42.2 45.0  Platelets 150 - 400 K/uL 169 192   Lipid Panel     Component Value Date/Time   CHOL 173 10/13/2019 1553   TRIG 265 (H) 10/13/2019 1553   HDL 35 (L) 10/13/2019 1553   CHOLHDL 4.9 10/13/2019 1553   VLDL 53 (H) 10/13/2019 1553   LDLCALC 85 10/13/2019 1553   HEMOGLOBIN A1C Lab Results  Component Value Date   HGBA1C 5.6 10/13/2019   MPG 114.02 10/13/2019   TSH Recent Labs    10/13/19 2358  TSH 1.816   Medications and allergies   Allergies  Allergen Reactions  . Proventil [Albuterol] Shortness Of Breath  . Sulfamethoxazole Nausea And Vomiting  . Morphine And Related Rash  . Oxycodone-Acetaminophen Nausea Only     Current Outpatient Medications  Medication Instructions  . aspirin 81 mg, Oral, Daily  . atorvastatin (LIPITOR) 80 mg, Oral, Daily-1800  . famotidine (PEPCID) 40 mg, Oral, Daily at bedtime  . losartan (COZAAR) 25 mg, Oral, Daily  . metoprolol succinate (TOPROL-XL) 50 mg, Oral, Daily, Take with or immediately following a meal.  . nitroGLYCERIN (NITROSTAT) 0.4 mg, Sublingual, Every 5 min PRN  . ticagrelor (BRILINTA) 90 mg, Oral, 2 times daily  . VITAMIN D PO 1 tablet, Oral, Daily    Radiology:  No results found.  Cardiac Studies:   Left Heart Catheterization 10/13/19: LV: Normal LV systolic function, no wall motion abnormality. EDP 9 mmHg. RCA: Dominant, normal. Left main: Large, normal. Circumflex: Large, normal. LAD: Proximal occlusion. SP thrombectomy with 5.5 F Pronto catheter and successful stenting with 3.5 x 22 mm resolute Onyx covering the proximal LAD and entire left main followed by wiring the circumflex and kissing balloon angioplasty with 3.0x12 and 2.5x15 mm Saphire balloon into ostial Cx and prox LAD and LM kissing balloon angioplasty performed at 14 atm in LM-Cx and 18 atm for Prox LAD-LM lesion for proximal  optimization. 100% stenosis reduced to 0% with TIMI 0 to TIMI-3 flow at the end of the procedure.  Echocardiogram 10/14/19:   1. Left ventricular ejection fraction, by estimation, is 50 to 55%. The  left ventricle has low normal function. The left ventricle has no regional  wall motion abnormalities. Left ventricular diastolic parameters were  normal. The average left ventricular  global longitudinal strain is 15.0 %, normal.  2. Right ventricular systolic function is normal. The right ventricular  size is normal.  EKG 10/21/2019: Normal sinus rhythm at rate of 75 bpm, normal axis, no evidence of ischemia, normal EKG. no significant change from 10/14/2019.  Assessment     ICD-10-CM   1. Atherosclerosis of native coronary artery of native heart without angina pectoris  I25.10 EKG 12-Lead    nitroGLYCERIN (NITROSTAT) 0.4 MG SL tablet  2. Primary hypertension  I10 CMP14+EGFR  CBC    CBC    CMP14+EGFR  3. Mixed hyperlipidemia  E78.2 Lipid Panel With LDL/HDL Ratio    Lipid Panel With LDL/HDL Ratio  4. OSA on CPAP  G47.33    Z99.89    Meds ordered this encounter  Medications  . nitroGLYCERIN (NITROSTAT) 0.4 MG SL tablet    Sig: Place 1 tablet (0.4 mg total) under the tongue every 5 (five) minutes as needed for up to 25 days for chest pain.    Dispense:  25 tablet    Refill:  3    There are no discontinued medications.   Recommendations:   Ruben Rosales  is a 64 y.o. Caucasian male with history of hypertension but no other cardiovascular history, strong family history of premature coronary disease, brother has had MI at age 25, father with MI at age 12 and grandmother paternal also had MI in her 12s. Admitted to the hospital on 10/12/2017 with NSTEMI involving occluded ostial LAD successful thrombectomy followed by stenting of left main and proximal LAD and balloon angioplasty of circumflex side branch on 10/13/2019. He now presents for hospital follow up.  EKG remains unchanged  with normal sinus rhythm.  He has not had any recurrence of angina pectoris. S/L NTG was prescribed and explained how to and when to use it and to notify us if there is change in frequency of use. Interaction with cialis-like agents (if applicable was discussed).  He needs repeat CBC, CMP and lipid profile testing in 2 months.  I would like to see him back at that time.  I encouraged him to join cardiac rehab.  I also encouraged him to establish with a local cardiologist in [place].  Patient would like to come back and see me in 2 months and then make a change.  Advised him to continue all his medications and to contact me if he has any other issues.  With regard to physical activity he can resume all his activities in 2 weeks as I would like his right forearm to heal.  He has mild hematoma and also ecchymosis in the right forearm.  His last sleep study was greater than 10 years ago, advised him to revisit this and he may need a new machine.  Adrian Prows, MD, [facility] 10/21/2019, 11:41 AM [facility]. [place] Office: [phone]

## 2019-10-24 NOTE — Telephone Encounter (Signed)
From pt

## 2019-11-04 ENCOUNTER — Other Ambulatory Visit: Payer: Self-pay | Admitting: Cardiology

## 2019-11-05 ENCOUNTER — Other Ambulatory Visit: Payer: Self-pay | Admitting: Cardiology

## 2019-12-21 LAB — LIPID PANEL WITH LDL/HDL RATIO
Cholesterol, Total: 78 mg/dL — ABNORMAL LOW (ref 100–199)
HDL: 31 mg/dL — ABNORMAL LOW (ref >39)
LDL Chol Calc (NIH): 27 mg/dL (ref 0–99)
LDL/HDL Ratio: 0.9 ratio (ref 0.0–3.6)
Triglycerides: 103 mg/dL (ref 0–149)
VLDL Cholesterol Cal: 20 mg/dL (ref 5–40)

## 2019-12-21 LAB — CMP14+EGFR
ALT: 59 IU/L — ABNORMAL HIGH (ref 0–44)
AST: 35 IU/L (ref 0–40)
Albumin/Globulin Ratio: 2.4 — ABNORMAL HIGH (ref 1.2–2.2)
Albumin: 4.6 g/dL (ref 3.8–4.8)
Alkaline Phosphatase: 70 IU/L (ref 39–117)
BUN/Creatinine Ratio: 12 (ref 10–24)
BUN: 14 mg/dL (ref 8–27)
Bilirubin Total: 0.6 mg/dL (ref 0.0–1.2)
CO2: 21 mmol/L (ref 20–29)
Calcium: 9.5 mg/dL (ref 8.6–10.2)
Chloride: 107 mmol/L — ABNORMAL HIGH (ref 96–106)
Creatinine, Ser: 1.13 mg/dL (ref 0.76–1.27)
GFR calc Af Amer: 78 mL/min/{1.73_m2} (ref >59)
GFR calc non Af Amer: 67 mL/min/{1.73_m2} (ref >59)
Globulin, Total: 1.9 g/dL (ref 1.5–4.5)
Glucose: 101 mg/dL — ABNORMAL HIGH (ref 65–99)
Potassium: 4.6 mmol/L (ref 3.5–5.2)
Sodium: 143 mmol/L (ref 134–144)
Total Protein: 6.5 g/dL (ref 6.0–8.5)

## 2019-12-21 LAB — CBC
Hematocrit: 48.5 % (ref 37.5–51.0)
Hemoglobin: 16.6 g/dL (ref 13.0–17.7)
MCH: 33.8 pg — ABNORMAL HIGH (ref 26.6–33.0)
MCHC: 34.2 g/dL (ref 31.5–35.7)
MCV: 99 fL — ABNORMAL HIGH (ref 79–97)
Platelets: 166 10*3/uL (ref 150–450)
RBC: 4.91 x10E6/uL (ref 4.14–5.80)
RDW: 13 % (ref 11.6–15.4)
WBC: 7.6 10*3/uL (ref 3.4–10.8)

## 2019-12-29 NOTE — Progress Notes (Signed)
Primary Physician/Referring:  Danton Sewer, MD  Patient ID: Ruben Rosales, male    DOB: [birth year], 64 y.o.   MRN: [id number]  Chief Complaint  Patient presents with  . Coronary Artery Disease  . Hyperlipidemia  . Follow-up    2 month   HPI:    Ruben Rosales  is a 64 y.o. Caucasian male with hypertension, strong family history of premature coronary disease, brother has had MI at age 83, father with MI at age 79 and grandmother paternal also had MI in her 7sth NSTEMI on 10/12/2017 with occluded ostial LAD successful thrombectomy followed by stenting of left main and proximal LAD and balloon angioplasty of circumflex side branch on 10/13/2019.   This is a 50-monthoffice visit follow-up, states he is more fatigued than usual since MI.  [facility] [place]  He has not had any recurrence of angina and has returned to his routine activity and work but is limited in lifting less than 10 pounds per recommendations.  Past Medical History:  Diagnosis Date  . Hypertension    Past Surgical History:  Procedure Laterality Date  . APPENDECTOMY    . CORONARY STENT INTERVENTION N/A 10/13/2019   Procedure: CORONARY STENT INTERVENTION;  Surgeon: GAdrian Prows MD;  [facility];  Service: Cardiovascular;  Laterality: N/A;  . CORONARY THROMBECTOMY N/A 10/13/2019   Procedure: Coronary Thrombectomy;  Surgeon: GAdrian Prows MD;  [facility];  Service: Cardiovascular;  Laterality: N/A;  . LEFT HEART CATH AND CORONARY ANGIOGRAPHY N/A 10/13/2019   Procedure: LEFT HEART CATH AND CORONARY ANGIOGRAPHY;  Surgeon: GAdrian Prows MD;  [facility];  Service: Cardiovascular;  Laterality: N/A;   Family History  Problem Relation Age of Onset  . Breast cancer Mother   . Heart attack Father   . Heart attack Brother     Social History   Tobacco Use  . Smoking status: Never Smoker  .  Smokeless tobacco: Never Used  Substance Use Topics  . Alcohol use: Yes    Alcohol/week: 1.0 standard drinks    Types: 1 Cans of beer per week    Comment: occasional   Marital Status: Married  ROS  Review of Systems  Constitution: Positive for malaise/fatigue. Negative for weight gain.  Cardiovascular: Negative for chest pain, dyspnea on exertion, leg swelling and syncope.  Respiratory: Positive for snoring (on CPAP and compliant). Negative for shortness of breath.   Musculoskeletal: Negative for joint swelling.  Gastrointestinal: Negative for melena.   Objective  Blood pressure 126/62, pulse 74, temperature 98.3 F (36.8 C), temperature source Temporal, resp. rate 15, height 5' 8" (1.727 m), weight 177 lb 4.8 oz (80.4 kg), SpO2 99 %.  Vitals with BMI 12/30/2019 10/21/2019 10/14/2019  Height 5' 8" 5' 10" -  Weight 177 lbs 5 oz 172 lbs 13 oz -  BMI 219.14278.29-  Systolic [id number] Diastolic 62 56 59  Pulse 74 83 88     Physical Exam  Constitutional: He appears well-developed and well-nourished. No distress.  Cardiovascular: Normal rate, regular rhythm and intact distal pulses. Exam reveals no gallop.  No murmur heard. Right forearm extensive ecchymosis noted.  Mild tenderness present.  Radial pulses excellent.  No leg edema. No JVD.    Pulmonary/Chest: Effort normal and breath sounds normal. No accessory muscle usage. No respiratory distress.  Abdominal: Soft.   Laboratory  examination:   Recent Labs    10/13/19 1553 10/13/19 2358 12/20/19 0824  NA 141 140 143  K 3.8 4.1 4.6  CL 109 117* 107*  CO2 19* 17* 21  GLUCOSE 124* 115* 101*  BUN _0 CREATININE 1.23 1.08 1.13  CALCIUM 9.3 8.6* 9.5  GFRNONAA >60 >60 67  GFRAA >60 >60 78   estimated creatinine clearance is 62.2 mL/min (by C-G formula based on SCr of 1.13 mg/dL).  CMP Latest Ref Rng & Units 12/20/2019 10/13/2019 10/13/2019  Glucose 65 - 99 mg/dL 101(H) 115(H) 124(H)  BUN 8 - 27 mg/dL _1 Creatinine  0.76 - 1.27 mg/dL 1.13 1.08 1.23  Sodium 134 - 144 mmol/L 143 140 141  Potassium 3.5 - 5.2 mmol/L 4.6 4.1 3.8  Chloride 96 - 106 mmol/L 107(H) 117(H) 109  CO2 20 - 29 mmol/L 21 17(L) 19(L)  Calcium 8.6 - 10.2 mg/dL 9.5 8.6(L) 9.3  Total Protein 6.0 - 8.5 g/dL 6.5 - -  Total Bilirubin 0.0 - 1.2 mg/dL 0.6 - -  Alkaline Phos 39 - 117 IU/L 70 - -  AST 0 - 40 IU/L 35 - -  ALT 0 - 44 IU/L 59(H) - -   CBC Latest Ref Rng & Units 12/20/2019 10/13/2019 10/13/2019  WBC 3.4 - 10.8 x10E3/uL 7.6 7.8 6.7  Hemoglobin 13.0 - 17.7 g/dL 16.6 15.3 16.2  Hematocrit 37.5 - 51.0 % 48.5 42.2 45.0  Platelets 150 - 450 x10E3/uL 166 169 192   Lipid Panel     Component Value Date/Time   CHOL 78 (L) 12/20/2019 0824   TRIG 103 12/20/2019 0824   HDL 31 (L) 12/20/2019 0824   CHOLHDL 4.9 10/13/2019 1553   VLDL 53 (H) 10/13/2019 1553   LDLCALC 27 12/20/2019 0824   HEMOGLOBIN A1C Lab Results  Component Value Date   HGBA1C 5.6 10/13/2019   MPG 114.02 10/13/2019   TSH Recent Labs    10/13/19 2358  TSH 1.816   Medications and allergies   Allergies  Allergen Reactions  . Proventil [Albuterol] Shortness Of Breath  . Sulfamethoxazole Nausea And Vomiting  . Morphine And Related Rash  . Oxycodone-Acetaminophen Nausea Only     Current Outpatient Medications  Medication Instructions  . aspirin 81 mg, Oral, Daily  . atorvastatin (LIPITOR) 80 mg, Oral, Daily-1800  . BRILINTA 90 MG TABS tablet TAKE 1 TABLET (90 MG TOTAL) BY MOUTH 2 (TWO) TIMES DAILY.  . famotidine (PEPCID) 40 mg, Oral, Daily at bedtime  . losartan (COZAAR) 25 mg, Oral, Daily  . metoprolol succinate (TOPROL-XL) 50 mg, Oral, Daily, Take with or immediately following a meal.  . nitroGLYCERIN (NITROSTAT) 0.4 mg, Sublingual, Every 5 min PRN  . VITAMIN D PO 1 tablet, Oral, Daily   Radiology:   No results found.  Cardiac Studies:   Left Heart Catheterization 10/13/19: LV: Normal LV systolic function, no wall motion abnormality. EDP 9  mmHg. RCA: Dominant, normal. Left main: Large, normal. Circumflex: Large, normal. LAD: Proximal occlusion. SP thrombectomy with 5.5 F Pronto catheter and successful stenting with 3.5 x 22 mm resolute Onyx covering the proximal LAD and entire left main followed by wiring the circumflex and kissing balloon angioplasty with 3.0x12 and 2.5x15 mm Saphire balloon into ostial Cx and prox LAD and LM kissing balloon angioplasty performed at 14 atm in LM-Cx and 18 atm for Prox LAD-LM lesion for proximal optimization. 100% stenosis reduced to 0% with TIMI 0 to TIMI-3 flow at the end  of the procedure.  Echocardiogram 10/14/19:   1. Left ventricular ejection fraction, by estimation, is 50 to 55%. The left ventricle has low normal function. The left ventricle has no regional wall motion abnormalities. Left ventricular diastolic parameters were normal.  The average left ventricular global longitudinal strain is 15.0 %, normal.  2. Right ventricular systolic function is normal. The right ventricular size is normal.  EKG    10/21/2019: Normal sinus rhythm at rate of 75 bpm, normal axis, no evidence of ischemia, normal EKG. no significant change from 10/14/2019.  Assessment     ICD-10-CM   1. Atherosclerosis of native coronary artery of native heart without angina pectoris  I25.10   2. Primary hypertension  I10   3. Mixed hyperlipidemia  E78.2   4. OSA on CPAP  G47.33    Z99.89     No orders of the defined types were placed in this encounter.   There are no discontinued medications.  Recommendations:   Ruben Rosales  is a 64 y.o. Caucasian male with history of hypertension but no other cardiovascular history, strong family history of premature coronary disease, brother has had MI at age 13, father with MI at age 74 and grandmother paternal also had MI in her 29s. Admitted to the hospital on 10/12/2017 with NSTEMI involving occluded ostial LAD successful thrombectomy followed by stenting of left main  and proximal LAD and balloon angioplasty of circumflex side branch on 10/13/2019. He now presents for 8 week f/u.  He has noticed fatigue since last office visit, but has not had any further chest pain, no clinical evidence of heart failure.  Suspect his symptoms are related to high-dose high intensity statin, will give him a statin holiday for 2 weeks followed by reducing the dose of Lipitor from 80 mg to 40 mg daily.  If symptoms do recur, he can even be treated with either 20 mg of atorvastatin or switch to Crestor.  With regard to sleep apnea, he has a close follow-up with his sleep medicine physician.  They suspect Brilinta could be causing central sleep apnea which could explain some of the fatigue as well.  He is on appropriate medical therapy, blood pressure is also well controlled.  I have reviewed his labs, reassured him.  From cardiac standpoint he can return to full activity without limitations.  As he is a stable and now established with Dr. Reynaldo Minium, cardiology at [facility].  I will see him back on a as needed basis.   Adrian Prows, MD, [facility] 12/30/2019, 10:16 AM [facility]. [place] Pager: [phone] Office: [phone]

## 2019-12-30 ENCOUNTER — Encounter: Payer: Self-pay | Admitting: Cardiology

## 2019-12-30 ENCOUNTER — Ambulatory Visit: Payer: Medicare Other | Admitting: Cardiology

## 2019-12-30 ENCOUNTER — Other Ambulatory Visit: Payer: Self-pay

## 2019-12-30 VITALS — BP 126/62 | HR 74 | Temp 98.3°F | Resp 15 | Ht 68.0 in | Wt 177.3 lb

## 2019-12-30 DIAGNOSIS — I10 Essential (primary) hypertension: Secondary | ICD-10-CM

## 2019-12-30 DIAGNOSIS — I2510 Atherosclerotic heart disease of native coronary artery without angina pectoris: Secondary | ICD-10-CM

## 2019-12-30 DIAGNOSIS — E782 Mixed hyperlipidemia: Secondary | ICD-10-CM

## 2019-12-30 DIAGNOSIS — Z9989 Dependence on other enabling machines and devices: Secondary | ICD-10-CM

## 2019-12-30 DIAGNOSIS — G4733 Obstructive sleep apnea (adult) (pediatric): Secondary | ICD-10-CM

## 2019-12-30 MED ORDER — ATORVASTATIN CALCIUM 80 MG PO TABS: 40.0000 mg | ORAL_TABLET | Freq: Every day | ORAL | 2 refills | Status: DC

## 2020-01-07 ENCOUNTER — Other Ambulatory Visit: Payer: Self-pay | Admitting: Cardiology

## 2020-01-07 DIAGNOSIS — I2510 Atherosclerotic heart disease of native coronary artery without angina pectoris: Secondary | ICD-10-CM

## 2020-02-08 ENCOUNTER — Other Ambulatory Visit: Payer: Self-pay

## 2020-02-08 MED ORDER — ATORVASTATIN CALCIUM 40 MG PO TABS: 40.0000 mg | ORAL_TABLET | Freq: Every day | ORAL | 3 refills | Status: DC

## 2020-02-08 NOTE — Telephone Encounter (Signed)
From patient.

## 2020-05-07 ENCOUNTER — Other Ambulatory Visit: Payer: Self-pay | Admitting: Cardiology

## 2020-05-07 DIAGNOSIS — E782 Mixed hyperlipidemia: Secondary | ICD-10-CM

## 2020-05-07 DIAGNOSIS — I2510 Atherosclerotic heart disease of native coronary artery without angina pectoris: Secondary | ICD-10-CM

## 2020-05-07 NOTE — Telephone Encounter (Signed)
I am good to Rx this with 90 day Rx and forward future Rx to [facility] in [place]-  [employer]

## 2020-05-07 NOTE — Telephone Encounter (Signed)
Please advise. Thanks.  

## 2020-05-07 NOTE — Telephone Encounter (Signed)
Is changing the tablet dosage and instructions okay for this refill?

## 2020-08-06 ENCOUNTER — Other Ambulatory Visit: Payer: Self-pay | Admitting: Cardiology

## 2020-10-15 ENCOUNTER — Other Ambulatory Visit: Payer: Self-pay | Admitting: Cardiology

## 2020-10-15 IMAGING — DX DG CHEST 1V PORT
1 series · 1 of 1 positions shown · non-contrast
Comparison: None.

CLINICAL DATA: 64-year-old male with chest pain.

EXAM:
PORTABLE CHEST 1 VIEW

[chest ap]
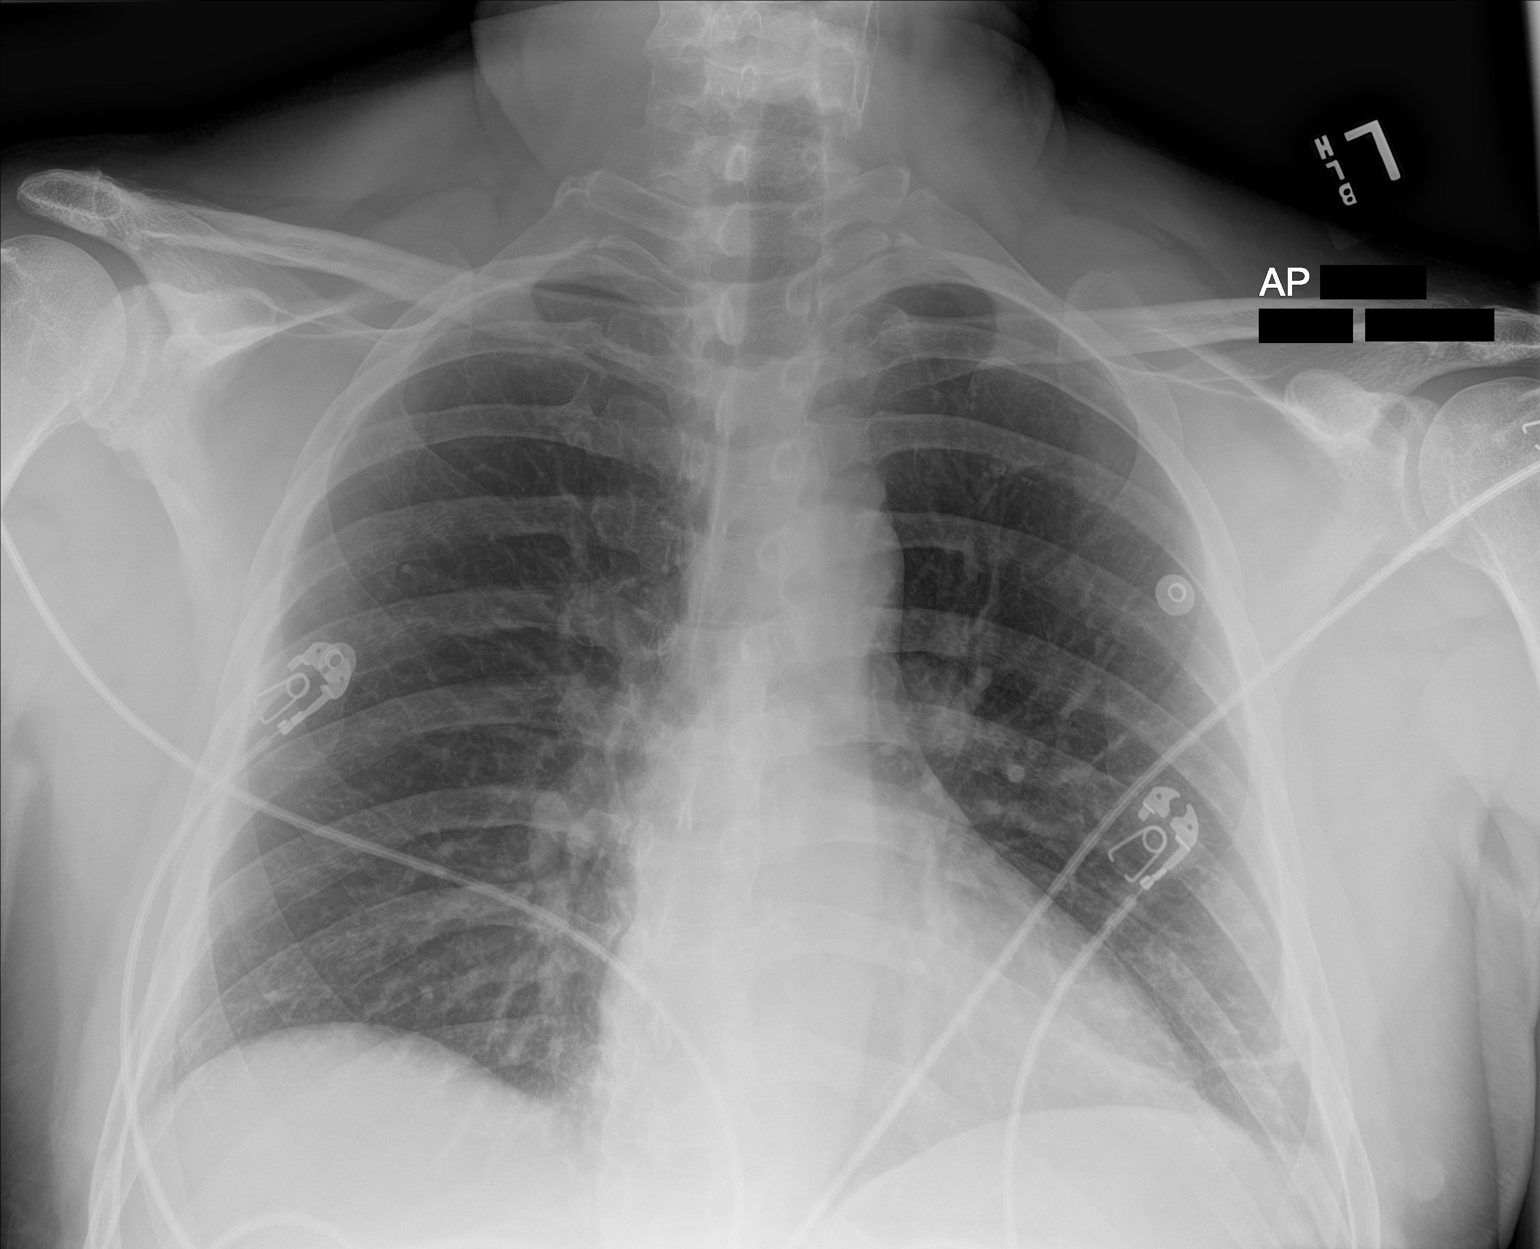

[1 of 1 positions shown; findings below may reference images not displayed]

FINDINGS: Minimal left lung base linear atelectasis/scarring. No focal
consolidation, pleural effusion, or pneumothorax. The cardiac
silhouette is within normal limits. No acute osseous pathology.
IMPRESSION: No active disease.

## 2021-04-08 ENCOUNTER — Other Ambulatory Visit: Payer: Self-pay | Admitting: Cardiology
